# Patient Record
Sex: Male | Born: 1995 | Hispanic: No | Marital: Single | Smoking: Former smoker
Health system: Southern US, Community
[De-identification: ages and names within clinical notes are randomized; demographics above are authoritative.]

## PROBLEM LIST (undated history)

## (undated) DIAGNOSIS — G935 Compression of brain: Secondary | ICD-10-CM

## (undated) DIAGNOSIS — J45909 Unspecified asthma, uncomplicated: Secondary | ICD-10-CM

## (undated) HISTORY — DX: Compression of brain: G93.5

## (undated) HISTORY — PX: TONSILLECTOMY: SUR1361

---

## 2016-02-23 ENCOUNTER — Encounter (HOSPITAL_COMMUNITY): Payer: Self-pay | Admitting: Emergency Medicine

## 2016-02-23 ENCOUNTER — Emergency Department (HOSPITAL_COMMUNITY)
Admission: EM | Admit: 2016-02-23 | Discharge: 2016-02-23 | Disposition: A | Discharge status: Home / Self Care | Payer: Managed Care, Other (non HMO) | Attending: Emergency Medicine | Admitting: Emergency Medicine

## 2016-02-23 ENCOUNTER — Emergency Department (HOSPITAL_COMMUNITY): Payer: Managed Care, Other (non HMO)

## 2016-02-23 DIAGNOSIS — N200 Calculus of kidney: Secondary | ICD-10-CM | POA: Diagnosis not present

## 2016-02-23 DIAGNOSIS — J45909 Unspecified asthma, uncomplicated: Secondary | ICD-10-CM | POA: Diagnosis not present

## 2016-02-23 DIAGNOSIS — R109 Unspecified abdominal pain: Secondary | ICD-10-CM | POA: Diagnosis present

## 2016-02-23 HISTORY — DX: Unspecified asthma, uncomplicated: J45.909

## 2016-02-23 LAB — BASIC METABOLIC PANEL
Anion gap: 14 (ref 5–15)
BUN: 14 mg/dL (ref 6–20)
CALCIUM: 9.7 mg/dL (ref 8.9–10.3)
CO2: 22 mmol/L (ref 22–32)
CREATININE: 1.14 mg/dL (ref 0.61–1.24)
Chloride: 105 mmol/L (ref 101–111)
GFR calc non Af Amer: >60 mL/min (ref >60)
GLUCOSE: 110 mg/dL — AB (ref 65–99)
Potassium: 3.6 mmol/L (ref 3.5–5.1)
Sodium: 141 mmol/L (ref 135–145)

## 2016-02-23 LAB — URINALYSIS, ROUTINE W REFLEX MICROSCOPIC
BILIRUBIN URINE: NEGATIVE
GLUCOSE, UA: NEGATIVE mg/dL
KETONES UR: NEGATIVE mg/dL
Leukocytes, UA: NEGATIVE
NITRITE: NEGATIVE
PH: 8.5 — AB (ref 5.0–8.0)
PROTEIN: NEGATIVE mg/dL
Specific Gravity, Urine: 1.014 (ref 1.005–1.030)

## 2016-02-23 LAB — CBC
HEMATOCRIT: 47.1 % (ref 39.0–52.0)
HEMOGLOBIN: 16.4 g/dL (ref 13.0–17.0)
MCH: 30.9 pg (ref 26.0–34.0)
MCHC: 34.8 g/dL (ref 30.0–36.0)
MCV: 88.7 fL (ref 78.0–100.0)
PLATELETS: 222 10*3/uL (ref 150–400)
RBC: 5.31 MIL/uL (ref 4.22–5.81)
RDW: 12.3 % (ref 11.5–15.5)
WBC: 10.2 10*3/uL (ref 4.0–10.5)

## 2016-02-23 LAB — URINE MICROSCOPIC-ADD ON: WBC, UA: NONE SEEN WBC/hpf (ref 0–5)

## 2016-02-23 MED ORDER — ONDANSETRON HCL 4 MG/2ML IJ SOLN
4.0000 mg | Freq: Once | INTRAMUSCULAR | Status: AC
  Administered 2016-02-23: 4 mg via INTRAVENOUS
  Filled 2016-02-23: qty 2

## 2016-02-23 MED ORDER — SODIUM CHLORIDE 0.9 % IV BOLUS (SEPSIS)
1000.0000 mL | Freq: Once | INTRAVENOUS | Status: AC
  Administered 2016-02-23: 1000 mL via INTRAVENOUS

## 2016-02-23 MED ORDER — HYDROMORPHONE HCL 1 MG/ML IJ SOLN
1.0000 mg | Freq: Once | INTRAMUSCULAR | Status: AC
  Administered 2016-02-23: 1 mg via INTRAVENOUS
  Filled 2016-02-23: qty 1

## 2016-02-23 MED ORDER — MORPHINE SULFATE (PF) 4 MG/ML IV SOLN
4.0000 mg | Freq: Once | INTRAVENOUS | Status: AC
  Administered 2016-02-23: 4 mg via INTRAVENOUS
  Filled 2016-02-23: qty 1

## 2016-02-23 MED ORDER — KETOROLAC TROMETHAMINE 30 MG/ML IJ SOLN
30.0000 mg | Freq: Once | INTRAMUSCULAR | Status: AC
  Administered 2016-02-23: 30 mg via INTRAVENOUS
  Filled 2016-02-23: qty 1

## 2016-02-23 NOTE — Discharge Instructions (Signed)
Please read and follow all provided instructions.  Your diagnoses today include:  1. Nephrolithiasis    Tests performed today include:  Urine test that showed blood in your urine and no infection  CT scan which showed a 1-2 millimeter kidney on the right side  Blood test that showed normal kidney function  Vital signs. See below for your results today.   Medications prescribed:   Take any prescribed medications only as directed.  Home care instructions:  Follow any educational materials contained in this packet.   BE VERY CAREFUL not to take multiple medicines containing Tylenol (also called acetaminophen). Doing so can lead to an overdose which can damage your liver and cause liver failure and possibly death.   Follow-up instructions: Please follow-up with your PCP if symptoms develop again.   If you need to return to the Emergency Department, go to [facility] and not [facility]. The urologists are located at [facility] and can better care for you at this location.  Return instructions:  If you need to return to the Emergency Department, go to [facility] and not [facility]. The urologists are located at [facility] and can better care for you at this location.   Please return to the Emergency Department if you experience worsening symptoms.  Please return if you develop fever or uncontrolled pain or vomiting.  Please return if you have any other emergent concerns.  Additional Information:  Your vital signs today were: BP 134/73 mmHg   Pulse 84   Temp(Src) 98 F (36.7 C) (Oral)   Resp 20   Ht 6' (1.829 m)   Wt 90.719 kg   BMI 27.12 kg/m2   SpO2 95% If your blood pressure (BP) was elevated above 135/85 this visit, please have this repeated by your doctor within one month. --------------

## 2016-02-23 NOTE — ED Provider Notes (Signed)
CSN: [id number]     Arrival date & time 02/23/16  1139 History   First MD Initiated Contact with Patient 02/23/16 1204     Chief Complaint  Patient presents with  . Back Pain   (Consider location/radiation/quality/duration/timing/severity/associated sxs/prior Treatment) HPI 20 y.o. male presents to the Emergency Department today complaining of back pain that began 45 min ago. 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 No hx kidney stones. No fevers reported. States that the pain is sharp and 10/10. Constant. No attempts at OTC remedies. No CP/SOB/ABD pain. Notes N/V no diarrhea. No other symptoms noted.   Past Medical History  Diagnosis Date  . Asthma    No past surgical history on file. No family history on file. Social History  Substance Use Topics  . Smoking status: None  . Smokeless tobacco: None  . Alcohol Use: None    Review of Systems ROS reviewed and all are negative for acute change except as noted in the HPI.  Allergies  Benadryl  Home Medications   Prior to Admission medications   Medication Sig Start Date End Date Taking? Authorizing Provider  methylPREDNISolone (MEDROL DOSEPAK) 4 MG TBPK tablet Reported on 02/23/2016 02/13/16   Historical Provider, MD  penicillin v potassium (VEETID) 500 MG tablet Take 500 mg by mouth 4 (four) times daily - after meals and at bedtime. Reported on 02/23/2016 02/13/16  Yes Historical Provider, MD   BP 141/71 mmHg  Pulse 70  Temp(Src) 98 F (36.7 C) (Oral)  Resp 16  Ht 6' (1.829 m)  Wt 90.719 kg  BMI 27.12 kg/m2  SpO2 100%   Physical Exam  Constitutional: He is oriented to person, place, and time. He appears well-developed and well-nourished.  Pt having trouble finding comfortable position due to pain in right flank.   HENT:  Head: Normocephalic and atraumatic.  Eyes: EOM are normal. Pupils are equal, round, and reactive to light.  Neck:  Normal range of motion. Neck supple. No tracheal deviation present.  Cardiovascular: Normal rate, regular rhythm and normal heart sounds.   Pulmonary/Chest: Effort normal and breath sounds normal.  Abdominal: Soft. Normal appearance and bowel sounds are normal. There is tenderness. There is CVA tenderness. There is no rigidity, no rebound, no guarding, no tenderness at McBurney's point and negative Murphy's sign.  Musculoskeletal: Normal range of motion.       Cervical back: Normal.       Thoracic back: Normal.       Lumbar back: Normal.  Neurological: He is alert and oriented to person, place, and time.  Skin: Skin is warm and dry.  Psychiatric: He has a normal mood and affect. His behavior is normal. Thought content normal.  Nursing note and vitals reviewed.  ED Course  Procedures (including critical care time) Labs Review Labs Reviewed  BASIC METABOLIC PANEL - Abnormal; Notable for the following:    Glucose, Bld 110 (*)    All other components within normal limits  URINALYSIS, ROUTINE W REFLEX MICROSCOPIC (NOT AT [facility]) - Abnormal; Notable for the following:    pH 8.5 (*)    Hgb urine dipstick LARGE (*)    All other components within normal limits  URINE MICROSCOPIC-ADD ON - Abnormal; Notable for the following:    Squamous Epithelial / LPF 0-5 (*)    Bacteria, UA RARE (*)    All other components within normal limits  CBC   Imaging Review Ct Renal Stone Study  02/23/2016  CLINICAL DATA:  20 year old with acute onset of right flank pain while at work earlier today. 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 EXAM: CT ABDOMEN AND PELVIS WITHOUT CONTRAST TECHNIQUE: Multidetector CT imaging of the abdomen and pelvis was performed following the standard protocol without IV contrast. COMPARISON:  None. FINDINGS: Lower chest:  Heart size normal.  Visualized lung bases clear. Hepatobiliary: Normal unenhanced appearance  of the liver. Gallbladder normal in appearance without calcified gallstones. No biliary ductal dilation. Pancreas: Normal unenhanced appearance. Spleen: Normal unenhanced appearance. Adrenals/Urinary Tract: Normal appearing adrenal glands. Approximate 1-2 mm calculus at the right UVJ without associated right hydronephrosis. No urinary tract calculi elsewhere on either side. No evidence of right renal edema or perinephric edema. Within the limits of the unenhanced technique, no focal parenchymal abnormality involving either kidney. Normal appearing urinary bladder. Stomach/Bowel: Stomach normal in appearance for the degree of distention. Normal-appearing small bowel. Normal-appearing colon with expected stool burden. Normal-appearing appendix without evidence of inflammation. Vascular/Lymphatic: No evidence of atherosclerosis. No pathologic lymphadenopathy. Reproductive: Prostate gland and seminal vesicles normal in size and appearance for age. Other: None. Musculoskeletal: Regional skeleton intact without acute or significant osseous abnormality. IMPRESSION: 1. Nonobstructing 1-2 mm calculus at the right UVJ. 2. Otherwise normal examination. Electronically Signed   By: Hulan Saas M.D.   On: 02/23/2016 13:56   I have personally reviewed and evaluated these images and lab results as part of my medical decision-making.   EKG Interpretation None      MDM  I have reviewed and evaluated the relevant laboratory values. I have reviewed and evaluated the relevant imaging studies. I have reviewed the relevant previous healthcare records. I obtained HPI from historian. Patient discussed with supervising physician  ED Course:  Assessment: 19y M Pt has been diagnosed with a Kidney Stone via CT. There is no evidence of significant hydronephrosis, serum creatine WNL, vitals sign stable and the pt does not have irratractable vomiting. On exam, Lungs CTA. Heart RRR. Abdomen nontender/soft. Has CVA tenderness.  Given fluids and pain medications in ED. Pt will be dc home with pain medications & has been advised to follow up with PCP.  2:40 PM- While giving urine sample, patient was able to pass the stone. No longer symptomatic. At time of discharge, Patient is in no acute distress. Vital Signs are stable. Patient is able to ambulate. Patient able to tolerate PO.   Disposition/Plan:  DC Home Additional Verbal discharge instructions given and discussed with patient.  Pt Instructed to f/u with PCP if further symptoms develop Return precautions given Pt acknowledges and agrees with plan  Supervising Physician Azalia Bilis, MD   Final diagnoses:  Nephrolithiasis      Audry Pili, PA-C 02/23/16 1506  Azalia Bilis, MD 02/23/16 [phone]

## 2016-02-23 NOTE — ED Notes (Signed)
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 Pt denies any urinary symptoms. Pt is alert and oriented x 4.

## 2016-06-30 ENCOUNTER — Emergency Department (HOSPITAL_COMMUNITY)
Admission: EM | Admit: 2016-06-30 | Discharge: 2016-06-30 | Disposition: A | Discharge status: Home / Self Care | Payer: Managed Care, Other (non HMO) | Attending: Emergency Medicine | Admitting: Emergency Medicine

## 2016-06-30 ENCOUNTER — Encounter (HOSPITAL_COMMUNITY): Payer: Self-pay | Admitting: Emergency Medicine

## 2016-06-30 ENCOUNTER — Emergency Department (HOSPITAL_COMMUNITY): Payer: Managed Care, Other (non HMO)

## 2016-06-30 DIAGNOSIS — Z79899 Other long term (current) drug therapy: Secondary | ICD-10-CM | POA: Insufficient documentation

## 2016-06-30 DIAGNOSIS — J45909 Unspecified asthma, uncomplicated: Secondary | ICD-10-CM | POA: Insufficient documentation

## 2016-06-30 DIAGNOSIS — M79662 Pain in left lower leg: Secondary | ICD-10-CM | POA: Insufficient documentation

## 2016-06-30 DIAGNOSIS — M79605 Pain in left leg: Secondary | ICD-10-CM

## 2016-06-30 NOTE — Discharge Instructions (Signed)

## 2016-06-30 NOTE — ED Notes (Signed)
Per patient, he was playing softball last Tuesday (6-27) and he slid into base.  Patient saw MD on 7-1, and was given antibiotics.  He is still taking medication.  He states left leg is still sore and deep pain within.  He states he has limited ROM and walking with left leg.

## 2016-06-30 NOTE — ED Provider Notes (Signed)
CSN: [id number]     Arrival date & time 06/30/16  1234 History   First MD Initiated Contact with Patient 06/30/16 1259     Chief Complaint  Patient presents with  . Leg Pain     (Consider location/radiation/quality/duration/timing/severity/associated sxs/prior Treatment) HPI Comments: Sx started after direct trauma caused by sliding into an object Seen at urgent care and tx with abx for lle superficial abrasion Noted pain to left ant tib without calf tenderness No compartment sx  Patient is a 20 y.o. male presenting with leg pain. The history is provided by the patient.  Leg Pain Location:  Leg Time since incident:  7 days Leg location:  L lower leg Pain details:    Quality:  Dull   Radiates to:  Does not radiate   Severity:  Moderate   Onset quality:  Gradual   Duration:  7 days   Timing:  Constant   Progression:  Worsening Chronicity:  New Dislocation: no   Foreign body present:  No foreign bodies Prior injury to area:  Yes Worsened by:  Bearing weight Ineffective treatments:  None tried Associated symptoms: swelling   Associated symptoms: no fever, no numbness and no tingling     Past Medical History  Diagnosis Date  . Asthma    History reviewed. No pertinent past surgical history. No family history on file. Social History  Substance Use Topics  . Smoking status: Never Smoker   . Smokeless tobacco: Never Used  . Alcohol Use: Yes    Review of Systems  Constitutional: Negative for fever.  All other systems reviewed and are negative.     Allergies  Benadryl  Home Medications   Prior to Admission medications   Medication Sig Start Date End Date Taking? Authorizing Provider  methylPREDNISolone (MEDROL DOSEPAK) 4 MG TBPK tablet Reported on 02/23/2016 02/13/16   Historical Provider, MD  penicillin v potassium (VEETID) 500 MG tablet Take 500 mg by mouth 4 (four) times daily - after meals and at bedtime. Reported on 02/23/2016 02/13/16   Historical Provider, MD     BP 147/72 mmHg  Pulse 77  Temp(Src) 98 F (36.7 C) (Oral)  Resp 16  SpO2 100% Physical Exam  Constitutional: He is oriented to person, place, and time. He appears well-developed and well-nourished.  Non-toxic appearance.  HENT:  Head: Normocephalic and atraumatic.  Eyes: Conjunctivae are normal. Pupils are equal, round, and reactive to light.  Neck: Normal range of motion.  Cardiovascular: Normal rate.   Pulmonary/Chest: Effort normal.  Musculoskeletal:       Legs: Neurological: He is alert and oriented to person, place, and time.  Skin: Skin is warm and dry.  Psychiatric: He has a normal mood and affect.  Nursing note and vitals reviewed.   ED Course  Procedures (including critical care time) Labs Review Labs Reviewed - No data to display  Imaging Review No results found. I have personally reviewed and evaluated these images and lab results as part of my medical decision-making.   EKG Interpretation None      MDM   Final diagnoses:  None    Patient's x-rays are negative at the tib-fib but does show a possible superior lateral patella fracture. Patient has no evidence of trauma to that area and has no complaints of pain. Patient will be made nonweightbearing.    Lorre NickAnthony Allyssa Abruzzese, MD 06/30/16 1350

## 2017-03-21 IMAGING — CT CT RENAL STONE PROTOCOL
2 of 4 series · 15 of 35 positions shown, 19 images · non-contrast
Comparison: None.

CLINICAL DATA: 19-year-old with acute onset of right flank pain
while at work earlier today. Patient works as a [HOSPITAL]
facility and was bending over while making a bed and developed the
right flank pain.

EXAM:
CT ABDOMEN AND PELVIS WITHOUT CONTRAST
TECHNIQUE: Multidetector CT imaging of the abdomen and pelvis was performed
following the standard protocol without IV contrast.

[Series 4: lung windows · axial · 0.78mm/px · z∈[-212,-122]mm · 12 of 21 slices shown, 16 images]
[im 2/21  soft-tissue]
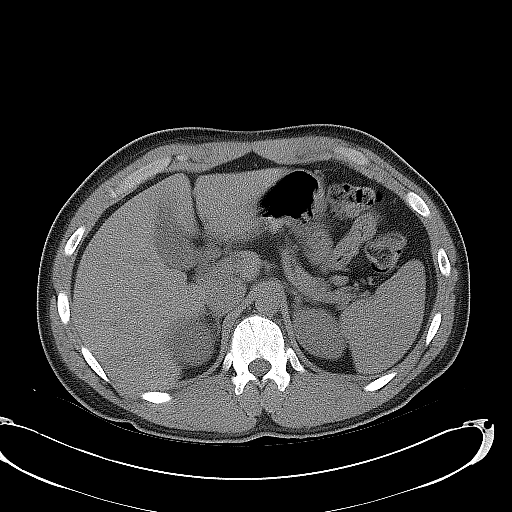
[im 2/21  bone]
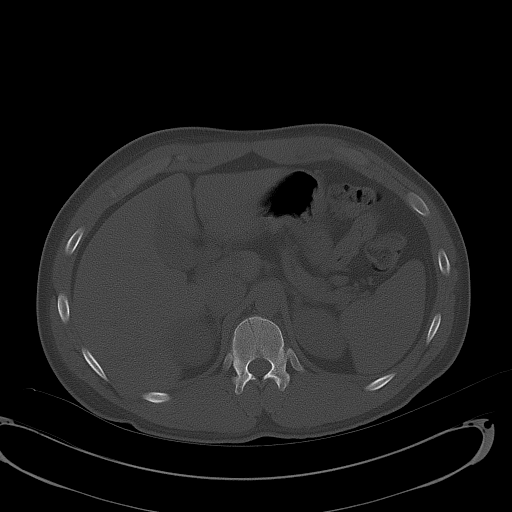
[im 4/21  soft-tissue]
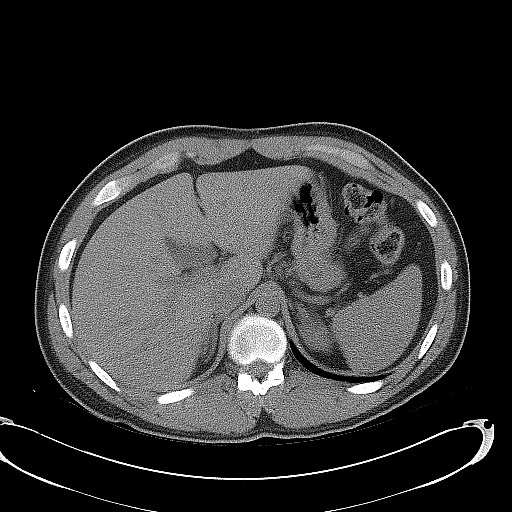
[im 6/21  soft-tissue]
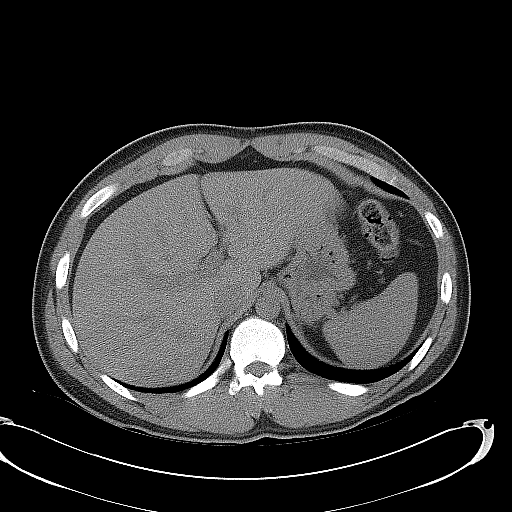
[im 8/21  soft-tissue]
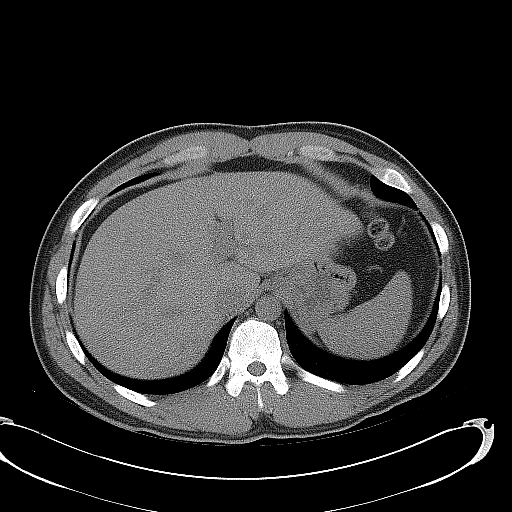
[im 10/21  soft-tissue]
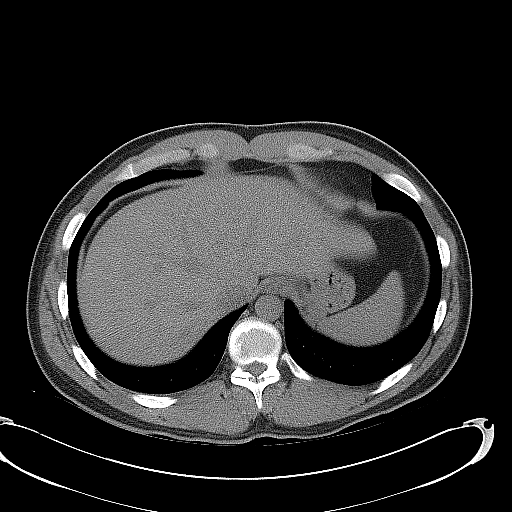
[im 12/21  soft-tissue]
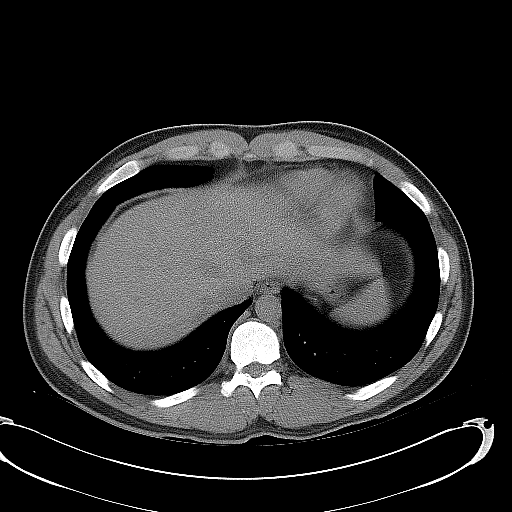
[im 14/21  soft-tissue]
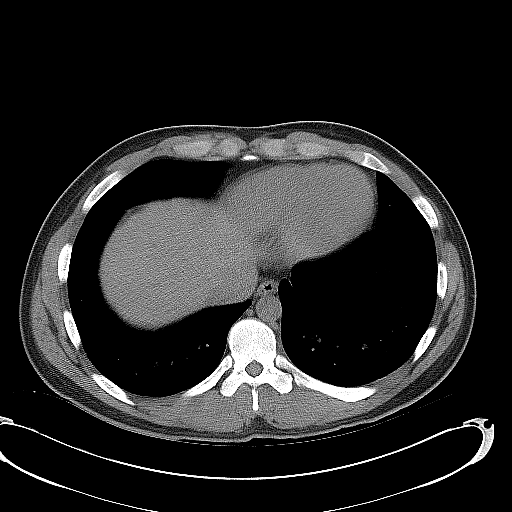
[im 16/21  soft-tissue]
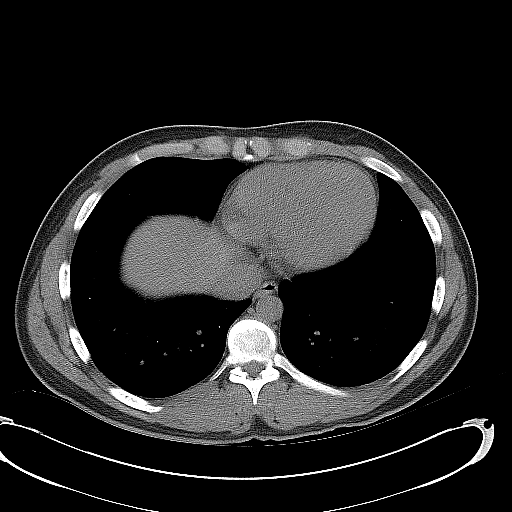
[im 17/21  lung]
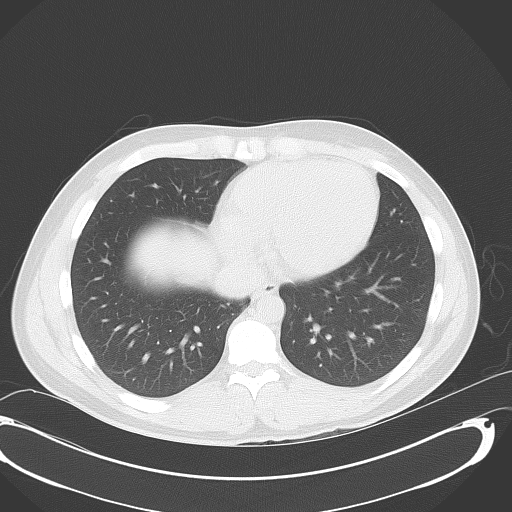
[im 18/21  soft-tissue]
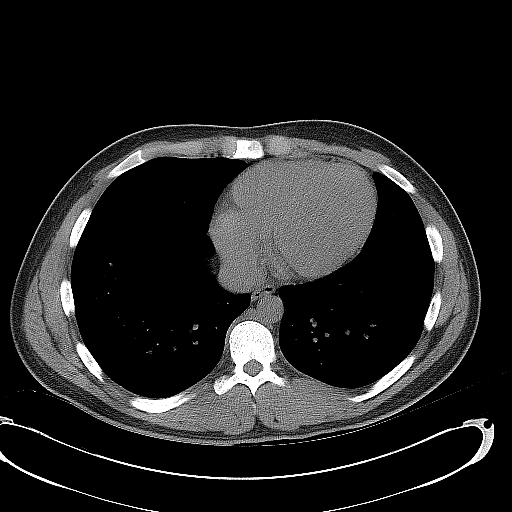
[im 18/21  lung]
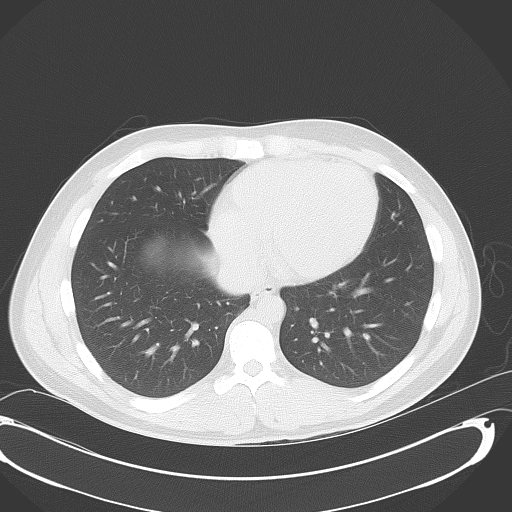
[im 18/21  bone]
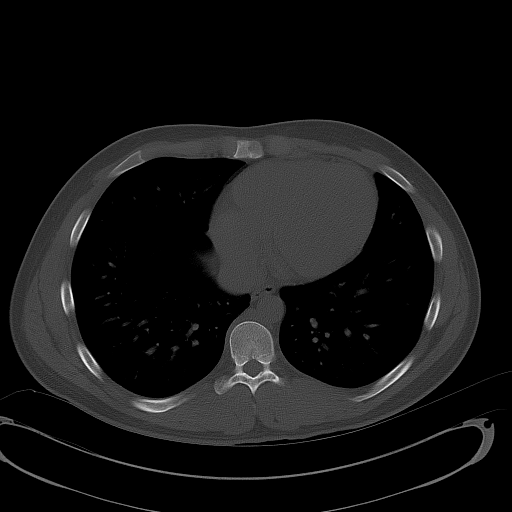
[im 19/21  lung]
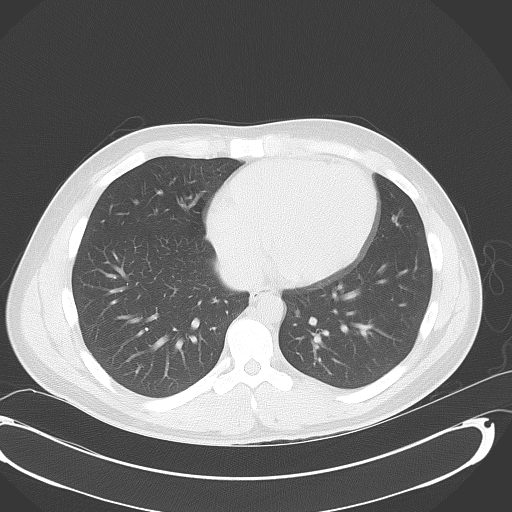
[im 20/21  soft-tissue]
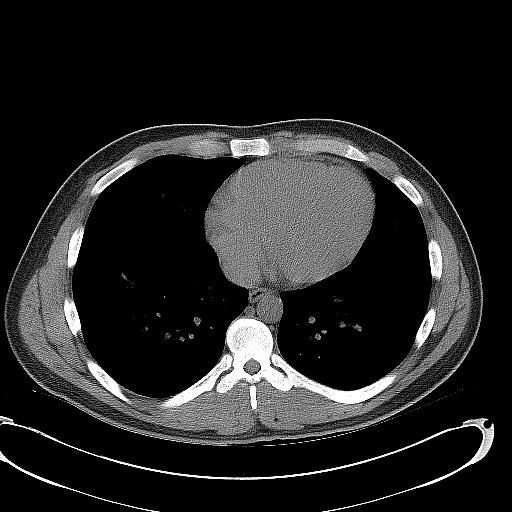
[im 20/21  lung]
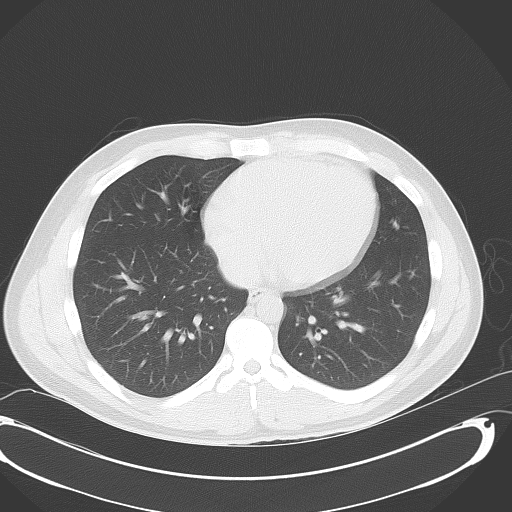

[Series 602: <mpr thick range> · coronal · 0.89mm/px · 3 of 132 slices shown]
[im 44/132  soft-tissue]
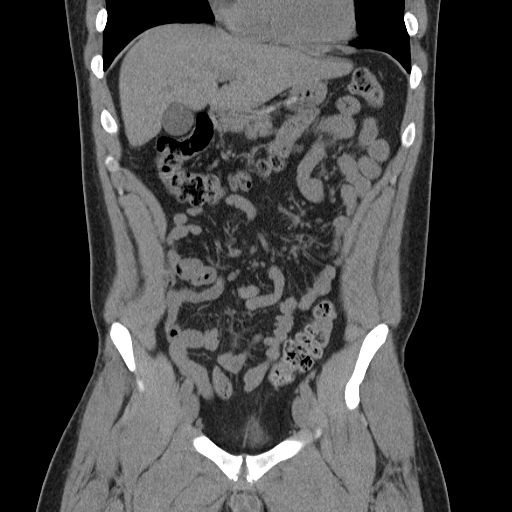
[im 59/132  soft-tissue]
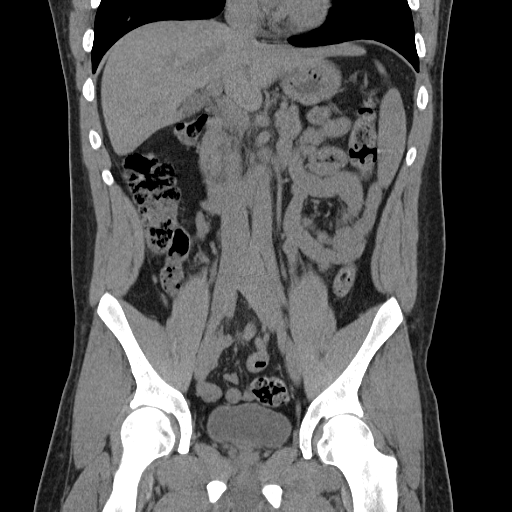
[im 73/132  soft-tissue]
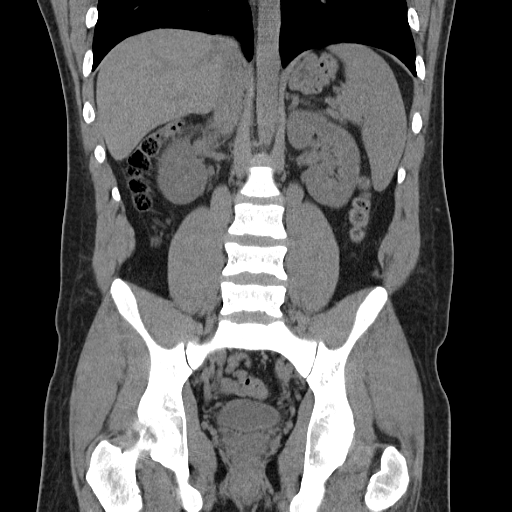

[15 of 35 positions shown; findings below may reference images not displayed]

FINDINGS: Lower chest:  Heart size normal.  Visualized lung bases clear.

Hepatobiliary: Normal unenhanced appearance of the liver.
Gallbladder normal in appearance without calcified gallstones. No
biliary ductal dilation.

Pancreas: Normal unenhanced appearance.

Spleen: Normal unenhanced appearance.

Adrenals/Urinary Tract: Normal appearing adrenal glands. Approximate
1-2 mm calculus at the right UVJ without associated right
hydronephrosis. No urinary tract calculi elsewhere on either side.
No evidence of right renal edema or perinephric edema. Within the
limits of the unenhanced technique, no focal parenchymal abnormality
involving either kidney. Normal appearing urinary bladder.

Stomach/Bowel: Stomach normal in appearance for the degree of
distention. Normal-appearing small bowel. Normal-appearing colon
with expected stool burden. Normal-appearing appendix without
evidence of inflammation.

Vascular/Lymphatic: No evidence of atherosclerosis. No pathologic
lymphadenopathy.

Reproductive: Prostate gland and seminal vesicles normal in size and
appearance for age.

Other: None.

Musculoskeletal: Regional skeleton intact without acute or
significant osseous abnormality.
IMPRESSION: 1. Nonobstructing 1-2 mm calculus at the right UVJ.
2. Otherwise normal examination.

## 2019-04-12 ENCOUNTER — Ambulatory Visit (HOSPITAL_COMMUNITY)
Admission: EM | Admit: 2019-04-12 | Discharge: 2019-04-12 | Disposition: A | Discharge status: Home / Self Care | Payer: Managed Care, Other (non HMO) | Attending: Family Medicine | Admitting: Family Medicine

## 2019-04-12 ENCOUNTER — Other Ambulatory Visit: Payer: Self-pay

## 2019-04-12 ENCOUNTER — Encounter (HOSPITAL_COMMUNITY): Payer: Self-pay

## 2019-04-12 DIAGNOSIS — M542 Cervicalgia: Secondary | ICD-10-CM | POA: Diagnosis not present

## 2019-04-12 DIAGNOSIS — R221 Localized swelling, mass and lump, neck: Secondary | ICD-10-CM

## 2019-04-12 MED ORDER — AMOXICILLIN-POT CLAVULANATE 875-125 MG PO TABS: 1.0000 | ORAL_TABLET | Freq: Two times a day (BID) | ORAL | 0 refills | Status: AC

## 2019-04-12 NOTE — ED Triage Notes (Signed)
Pt presents with pain and swelling on left side of neck X 4 days.

## 2019-04-12 NOTE — Discharge Instructions (Signed)
Rest and push fluids We will treat you for potential neck abscess today We will trial a prescription of augmentin.  Take as directed and to completion You may use OTC ibuprofen and/or tylenol as needed for pain Go to the ED if you have any new or worsening symptoms such as increased pain, redness, swelling, discharge, high fever, night sweats, abdominal pain, difficulty swallowing, difficulty breathing, painful neck movements, dental pain, if symptoms do not improve with medication, etc..Marland Kitchen

## 2019-04-12 NOTE — ED Provider Notes (Signed)
[facility]   [id number] 04/12/19 Arrival Time: 1020   CC: Neck pain and swelling  SUBJECTIVE: History from: patient.  Christopher Nash is a 23 y.o. male who presents with left sided neck pain and swelling x 4 days.  Denies sick exposure to COVID, flu or strep.  Denies recent travel.  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  Has tried OTC medications without relief.  Symptoms are made worse with neck ROM.  Reports previous symptoms in the past that resolved without intervention.   Denies fever, chills, fatigue, sinus pain, rhinorrhea, sore throat, dysphagia, odonyphagia, cough, SOB, wheezing, chest pain, nausea, changes in bowel or bladder habits.    ROS: As per HPI.  Past Medical History:  Diagnosis Date  . Asthma    History reviewed. No pertinent surgical history. Allergies  Allergen Reactions  . Benadryl [Diphenhydramine Hcl] Anaphylaxis   No current facility-administered medications on file prior to encounter.    No current outpatient medications on file prior to encounter.   Social History   Socioeconomic History  . Marital status: Single    Spouse name: Not on file  . Number of children: Not on file  . Years of education: Not on file  . Highest education level: Not on file  Occupational History  . Not on file  Social Needs  . Financial resource strain: Not on file  . Food insecurity:    Worry: Not on file    Inability: Not on file  . Transportation needs:    Medical: Not on file    Non-medical: Not on file  Tobacco Use  . Smoking status: Never Smoker  . Smokeless tobacco: Never Used  Substance and Sexual Activity  . Alcohol use: Yes  . Drug use: No  . Sexual activity: Yes  Lifestyle  . Physical activity:    Days per week: Not on file    Minutes per session: Not on file  . Stress: Not on file  Relationships  . Social connections:    Talks on phone: Not on file    Gets together: Not on file    Attends religious service: Not on file    Active  member of club or organization: Not on file    Attends meetings of clubs or organizations: Not on file    Relationship status: Not on file  . Intimate partner violence:    Fear of current or ex partner: Not on file    Emotionally abused: Not on file    Physically abused: Not on file    Forced sexual activity: Not on file  Other Topics Concern  . Not on file  Social History Narrative  . Not on file   History reviewed. No pertinent family history.  OBJECTIVE:  Vitals:   04/12/19 1048  BP: (!) 142/83  Pulse: 63  Resp: 18  Temp: 98.3 F (36.8 C)  TempSrc: Oral  SpO2: 98%     General appearance: alert; well-appearing, nontoxic; speaking in full sentences and tolerating own secretions HEENT: NCAT; Ears: EACs clear, RT TM pearly gray, LT TM mildly erythematous; Eyes: PERRL.  EOM grossly intact. Nose: nares patent without rhinorrhea, Throat: oropharynx clear, tonsils not erythematous or enlarged, uvula midline Neck: 3-4 x 2 cm area of induration over the left submandibular region, no overlying skin changes, mildly TTP; no obvious drainage or bleeding Lungs: unlabored respirations, symmetrical air entry; cough: absent; no respiratory distress; CTAB Heart: regular rate and rhythm.  Radial pulses 2+ symmetrical bilaterally Skin:  warm and dry Psychological: alert and cooperative; normal mood and affect  ASSESSMENT & PLAN:  1. Neck pain   2. Neck abscess     Meds ordered this encounter  Medications  . amoxicillin-clavulanate (AUGMENTIN) 875-125 MG tablet    Sig: Take 1 tablet by mouth every 12 (twelve) hours for 10 days.    Dispense:  20 tablet    Refill:  0    Order Specific Question:   Supervising Provider    Answer:   Eustace Moore [[id number]]   Rest and push fluids We will treat you for potential neck abscess today We will trial a prescription of augmentin.  Take as directed and to completion You may use OTC ibuprofen and/or tylenol as needed for pain Go to the ED if  you have any new or worsening symptoms such as increased pain, redness, swelling, discharge, high fever, night sweats, abdominal pain, difficulty swallowing, difficulty breathing, painful neck movements, dental pain, if symptoms do not improve with medication, etc...   Instructed patient to make a follow up appointment with Joaquin Courts FNP if symptoms persisted despite antibiotic intervention.  Patient verbalized understanding.    Reviewed expectations re: course of current medical issues. Questions answered. Outlined signs and symptoms indicating need for more acute intervention. Patient verbalized understanding. After Visit Summary given.         Rennis Harding, PA-C 04/12/19 1130

## 2019-04-27 ENCOUNTER — Encounter: Payer: Self-pay | Admitting: Family Medicine

## 2019-05-03 ENCOUNTER — Ambulatory Visit: Payer: Managed Care, Other (non HMO) | Admitting: Family Medicine

## 2021-10-08 ENCOUNTER — Encounter (HOSPITAL_BASED_OUTPATIENT_CLINIC_OR_DEPARTMENT_OTHER): Payer: Self-pay

## 2021-10-08 DIAGNOSIS — R0683 Snoring: Secondary | ICD-10-CM

## 2021-10-08 DIAGNOSIS — G4733 Obstructive sleep apnea (adult) (pediatric): Secondary | ICD-10-CM

## 2021-10-08 DIAGNOSIS — G4710 Hypersomnia, unspecified: Secondary | ICD-10-CM

## 2021-11-24 ENCOUNTER — Other Ambulatory Visit: Payer: Self-pay

## 2021-11-24 ENCOUNTER — Ambulatory Visit (HOSPITAL_BASED_OUTPATIENT_CLINIC_OR_DEPARTMENT_OTHER): Payer: Self-pay | Attending: Sleep Medicine | Admitting: Internal Medicine

## 2021-11-24 DIAGNOSIS — G4733 Obstructive sleep apnea (adult) (pediatric): Secondary | ICD-10-CM | POA: Insufficient documentation

## 2021-11-24 DIAGNOSIS — R0683 Snoring: Secondary | ICD-10-CM | POA: Insufficient documentation

## 2021-11-24 DIAGNOSIS — G4710 Hypersomnia, unspecified: Secondary | ICD-10-CM | POA: Insufficient documentation

## 2021-11-29 NOTE — Procedures (Signed)
   NAME: Christopher Nash DATE OF BIRTH:  1996-05-28 MEDICAL RECORD NUMBER [id number]  LOCATION: [facility]  PHYSICIAN: Deretha Emory  DATE OF STUDY: 11/24/2021  SLEEP STUDY TYPE: Nocturnal Polysomnogram               REFERRING PHYSICIAN: Alanda Slim, MD  EPWORTH SLEEPINESS SCORE:  10 HEIGHT: 5\' 11"  (180.3 cm)  WEIGHT: 240 lb (108.9 kg)    Body mass index is 33.47 kg/m.  NECK SIZE: 16 in.  CLINICAL INFORMATION The patient was referred to the sleep center for evaluation excessive daytime sleepiness, witnessed apnea, and snoring. 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   MEDICATIONS Patient self administered medications none including no sleep medicine administered.  SLEEP STUDY TECHNIQUE A multi-channel overnight Polysomnography study was performed. The channels recorded and monitored were central and occipital EEG, electrooculogram (EOG), submentalis EMG (chin), nasal and oral airflow, thoracic and abdominal wall motion, anterior tibialis EMG, snore microphone, electrocardiogram, and a pulse oximetry.  TECHNICAL COMMENTS Comments added by Technician: None Comments added by Scorer: N/A  SLEEP ARCHITECTURE The study was initiated at 11:06:38 PM and terminated at 5:09:06 AM. The total recorded time was 362.5 minutes. EEG confirmed total sleep time was 329.5 minutes yielding a sleep efficiency of 90.9%. Sleep onset after lights out was 4.9 minutes with a REM latency of 98.5 minutes. The patient spent 8.19% of the night in stage N1 sleep, 61.61% in stage N2 sleep, 1.97% in stage N3 and 28.2% in REM. Wake after sleep onset (WASO) was 28.1 minutes. The Arousal Index was 6.7/hour.  RESPIRATORY PARAMETERS There were a total of 47 respiratory disturbances out of which 12 were apneas ( 11 obstructive, 0 mixed, 1 central) and 35 hypopneas. The apnea/hypopnea index (AHI) was 8.6 events/hour. The central sleep apnea index was 0.2 events/hour. The REM AHI  was 23.9 events/hour and NREM AHI was 2.5 events/hour. The supine AHI was 31.0 events/hour and the non supine AHI was 7.5 events/hour. He slept supine during 4.70% of sleep. Respiratory disturbance index was 12/hr overall and 30/hour in REM sleep. Respiratory disturbances were associated with oxygen desaturation down to a nadir of 86.00% during sleep. The mean oxygen saturation during the study was 95.06%.  LEG MOVEMENT DATA The total leg movements were with a resulting leg movement index of /hr . Associated arousal with leg movement index was 0.0/hr.  CARDIAC DATA The underlying cardiac rhythm was most consistent with sinus rhythm. Mean heart rate during sleep was 47.57 bpm. Additional rhythm abnormalities include None.  IMPRESSIONS - Mild Obstructive Sleep apnea(OSA) - No Significant Central Sleep Apnea (CSA) - No significant periodic leg movements(PLMs) during sleep.   DIAGNOSIS - Obstructive Sleep Apnea (G47.33)  RECOMMENDATIONS - It is not clear that this mild degree of OSA is causing sleepiness especially in a patient with third shift job. If treatment is thought necessary, consider weight loss, oral appliance or CPAP   Marland Kitchen Sleep specialist, American Board of Internal Medicine  ELECTRONICALLY SIGNED ON:  11/29/2021, 9:27 AM [facility] PH: [phone]   FX: [phone] ACCREDITED BY THE AMERICAN ACADEMY OF SLEEP MEDICINE

## 2022-02-13 ENCOUNTER — Other Ambulatory Visit: Payer: Self-pay | Admitting: Internal Medicine

## 2022-02-13 ENCOUNTER — Ambulatory Visit
Admission: RE | Admit: 2022-02-13 | Discharge: 2022-02-13 | Disposition: A | Discharge status: Home / Self Care | Payer: No Typology Code available for payment source | Source: Ambulatory Visit | Attending: Internal Medicine | Admitting: Internal Medicine

## 2022-02-13 ENCOUNTER — Other Ambulatory Visit: Payer: Self-pay

## 2022-02-13 DIAGNOSIS — R0789 Other chest pain: Secondary | ICD-10-CM

## 2022-11-11 ENCOUNTER — Other Ambulatory Visit (HOSPITAL_BASED_OUTPATIENT_CLINIC_OR_DEPARTMENT_OTHER): Payer: Self-pay | Admitting: Internal Medicine

## 2022-11-11 DIAGNOSIS — R0789 Other chest pain: Secondary | ICD-10-CM

## 2022-11-18 ENCOUNTER — Ambulatory Visit
Admission: EM | Admit: 2022-11-18 | Discharge: 2022-11-18 | Disposition: A | Discharge status: Home / Self Care | Payer: No Typology Code available for payment source | Attending: Emergency Medicine | Admitting: Emergency Medicine

## 2022-11-18 DIAGNOSIS — L509 Urticaria, unspecified: Secondary | ICD-10-CM | POA: Diagnosis not present

## 2022-11-18 MED ORDER — LORATADINE 10 MG PO TABS: 10.0000 mg | ORAL_TABLET | Freq: Every day | ORAL | 0 refills | Status: DC

## 2022-11-18 MED ORDER — PREDNISONE 50 MG PO TABS: ORAL_TABLET | ORAL | 0 refills | Status: DC

## 2022-11-18 MED ORDER — EPINEPHRINE 0.3 MG/0.3ML IJ SOAJ: 0.3000 mg | Freq: Once | INTRAMUSCULAR | 0 refills | Status: AC

## 2022-11-18 MED ORDER — FAMOTIDINE 20 MG PO TABS: 20.0000 mg | ORAL_TABLET | Freq: Two times a day (BID) | ORAL | 0 refills | Status: DC

## 2022-11-18 MED ORDER — FAMOTIDINE 40 MG PO TABS
40.0000 mg | ORAL_TABLET | Freq: Once | ORAL | Status: AC
  Administered 2022-11-18: 40 mg via ORAL

## 2022-11-18 MED ORDER — EPINEPHRINE 0.3 MG/0.3ML IJ SOAJ: 0.3000 mg | Freq: Once | INTRAMUSCULAR | 0 refills | Status: DC

## 2022-11-18 MED ORDER — METHYLPREDNISOLONE SODIUM SUCC 40 MG IJ SOLR
125.0000 mg | Freq: Once | INTRAMUSCULAR | Status: AC
  Administered 2022-11-18: 125 mg via INTRAMUSCULAR

## 2022-11-18 NOTE — Discharge Instructions (Addendum)
You can start the oral steroids tomorrow.  Take a Claritin today.  Today take the Pepcid, Claritin for the next 5 days and prednisone for 4 more days-total 5 days.  Use the EpiPen if you start having wheezing, shortness of breath, tongue or lip swelling, sensation of throat swelling shut, abdominal pain, diarrhea, or if you pass out.  Go to the ER if you need to use the EpiPen. Go to the ER if you get worse.Marland Kitchen

## 2022-11-18 NOTE — ED Triage Notes (Signed)
Patient reports that he got off work this AM and noticed that he was broke out in hives from head to toes.

## 2022-11-18 NOTE — ED Provider Notes (Signed)
HPI  SUBJECTIVE:  Christopher Nash is a 26 y.o. male who presents with an intensely pruritic erythematous, raised rash beginning on his chest earlier this morning.  It has now spread to the rest of his torso and bilateral thighs.  No new lotions, soaps, detergents, change in medications, recent antibiotics, known exposure to allergens, exposure to cleaning supplies or dust.  No angioedema of the lips or tongue, sensation of throat swelling shut, cough, wheeze, shortness of breath, nausea, vomiting, diarrhea, abdominal pain, syncope, voice changes.  He tried taking a hot shower with improvement in his symptoms.  No aggravating factors.  Is not needed his albuterol inhaler.  He has a past medical history of asthma and food allergies.  He is also allergic to Benadryl/over-the-counter cough syrup as a child.  He has a past medical history of Chiari malformation.  No history of anaphylaxis.  PCP: [facility].    Past Medical History:  Diagnosis Date   Asthma    Chiari malformation type I [facility] [place])     Past Surgical History:  Procedure Laterality Date   TONSILLECTOMY      Family History  Problem Relation Age of Onset   Hypertension Maternal Grandmother     Social History   Tobacco Use   Smoking status: Former   Smokeless tobacco: Never  Substance Use Topics   Alcohol use: Yes   Drug use: No    No current facility-administered medications for this encounter.  Current Outpatient Medications:    EPINEPHrine 0.3 mg/0.3 mL IJ SOAJ injection, Inject 0.3 mg into the muscle once for 1 dose., Disp: 0.3 mL, Rfl: 0   famotidine (PEPCID) 20 MG tablet, Take 1 tablet (20 mg total) by mouth 2 (two) times daily., Disp: 10 tablet, Rfl: 0   loratadine (CLARITIN) 10 MG tablet, Take 1 tablet (10 mg total) by mouth daily for 5 days., Disp: 5 tablet, Rfl: 0   predniSONE (DELTASONE) 50 MG tablet, 1 tab po on day 1, one tab po on day 2, 1/2 tab po on days 3 and 4, Disp: 3 tablet, Rfl: 0  Allergies   Allergen Reactions   Benadryl [Diphenhydramine Hcl] Anaphylaxis   Other Other (See Comments)    Uncoded Allergy. Allergen: Grass, Other Reaction: Other reaction     ROS  As noted in HPI.   Physical Exam  BP (!) 151/95 (BP Location: Right Arm)   Pulse 62   Temp 97.8 F (36.6 C) (Oral)   Ht 5\' 11"  (1.803 m)   Wt 97.5 kg   SpO2 100%   BMI 29.99 kg/m   Constitutional: Well developed, well nourished, no acute distress Eyes:  EOMI, conjunctiva normal bilaterally HENT: Normocephalic, atraumatic,mucus membranes moist.  No angioedema of the tongue or lips.  Airway widely patent.  Normal voice. Respiratory: Normal inspiratory effort, lungs clear bilaterally Cardiovascular: Normal rate and rhythm, no murmurs rubs or gallops GI: nondistended skin: Diffuse, nontender, blanchable, erythematous urticaria over entire torso, bilateral thighs.             Musculoskeletal: no deformities Neurologic: Alert & oriented x 3, no focal neuro deficits Psychiatric: Speech and behavior appropriate   ED Course   Medications  methylPREDNISolone sodium succinate (SOLU-MEDROL) 40 mg/mL injection 125 mg (125 mg Intramuscular Given 11/18/22 1416)  famotidine (PEPCID) tablet 40 mg (40 mg Oral Given 11/18/22 1413)    No orders of the defined types were placed in this encounter.   No results found for this or any previous visit (from  the past 24 hour(s)). No results found.  ED Clinical Impression  1. Urticaria      ED Assessment/Plan    On initial evaluation, patient has no evidence of anaphylaxis.  Will give Solu-Medrol 125 mg IM, Pepcid 40 mg p.o.  No Benadryl because of childhood history of allergy.  Do not have second-generation antihistamine here unfortunately.  Will reevaluate in 45 minutes.  On reevaluation, patient states that he feels better.  He still has some areas of erythema but the urticaria is resolving.  Lungs are still clear.  No evidence of angioedema or  anaphylaxis.  Home with prednisone, Pepcid, Claritin, EpiPen.  He is to take Claritin today, and then start the prednisone, Pepcid and Claritin tomorrow.  Follow-up with PCP as needed.  Discussed  MDM, treatment plan, and plan for follow-up with patient. Discussed sn/sx that should prompt return to the ED. patient agrees with plan.   Meds ordered this encounter  Medications   methylPREDNISolone sodium succinate (SOLU-MEDROL) 40 mg/mL injection 125 mg   famotidine (PEPCID) tablet 40 mg   loratadine (CLARITIN) 10 MG tablet    Sig: Take 1 tablet (10 mg total) by mouth daily for 5 days.    Dispense:  5 tablet    Refill:  0   predniSONE (DELTASONE) 50 MG tablet    Sig: 1 tab po on day 1, one tab po on day 2, 1/2 tab po on days 3 and 4    Dispense:  3 tablet    Refill:  0   EPINEPHrine 0.3 mg/0.3 mL IJ SOAJ injection    Sig: Inject 0.3 mg into the muscle once for 1 dose.    Dispense:  0.3 mL    Refill:  0   famotidine (PEPCID) 20 MG tablet    Sig: Take 1 tablet (20 mg total) by mouth 2 (two) times daily.    Dispense:  10 tablet    Refill:  0      *This clinic note was created using Scientist, clinical (histocompatibility and immunogenetics). Therefore, there may be occasional mistakes despite careful proofreading.  ?    Domenick Gong, MD 11/18/22 [phone]

## 2022-11-24 ENCOUNTER — Ambulatory Visit: Payer: No Typology Code available for payment source

## 2022-12-02 ENCOUNTER — Ambulatory Visit
Admission: RE | Admit: 2022-12-02 | Discharge: 2022-12-02 | Disposition: A | Discharge status: Home / Self Care | Payer: No Typology Code available for payment source | Source: Ambulatory Visit | Attending: Internal Medicine | Admitting: Internal Medicine

## 2022-12-02 DIAGNOSIS — R0789 Other chest pain: Secondary | ICD-10-CM | POA: Insufficient documentation

## 2023-07-21 ENCOUNTER — Ambulatory Visit
Admission: EM | Admit: 2023-07-21 | Discharge: 2023-07-21 | Disposition: A | Discharge status: Home / Self Care | Payer: 59 | Attending: Emergency Medicine | Admitting: Emergency Medicine

## 2023-07-21 ENCOUNTER — Other Ambulatory Visit: Payer: Self-pay

## 2023-07-21 DIAGNOSIS — Z20822 Contact with and (suspected) exposure to covid-19: Secondary | ICD-10-CM | POA: Insufficient documentation

## 2023-07-21 DIAGNOSIS — J069 Acute upper respiratory infection, unspecified: Secondary | ICD-10-CM | POA: Insufficient documentation

## 2023-07-21 DIAGNOSIS — J4521 Mild intermittent asthma with (acute) exacerbation: Secondary | ICD-10-CM | POA: Diagnosis not present

## 2023-07-21 LAB — GROUP A STREP BY PCR: Group A Strep by PCR: NOT DETECTED

## 2023-07-21 LAB — SARS CORONAVIRUS 2 BY RT PCR: SARS Coronavirus 2 by RT PCR: NEGATIVE

## 2023-07-21 MED ORDER — IBUPROFEN 600 MG PO TABS: 600.0000 mg | ORAL_TABLET | Freq: Three times a day (TID) | ORAL | 0 refills | Status: AC | PRN

## 2023-07-21 MED ORDER — AEROCHAMBER PLUS FLO-VU MEDIUM MISC: 1.0000 | Freq: Once | Status: DC

## 2023-07-21 MED ORDER — PREDNISONE 20 MG PO TABS: 40.0000 mg | ORAL_TABLET | Freq: Every day | ORAL | 0 refills | Status: AC

## 2023-07-21 MED ORDER — FLUTICASONE PROPIONATE 50 MCG/ACT NA SUSP: 2.0000 | Freq: Every day | NASAL | 0 refills | Status: AC

## 2023-07-21 NOTE — ED Triage Notes (Signed)
Pt states he has had a runny nose, cough sore throat and chest congestion. Denies fever and symptoms started last night

## 2023-07-21 NOTE — ED Provider Notes (Signed)
HPI  SUBJECTIVE:  Christopher Nash is a 27 y.o. male who presents with sore throat, nasal congestion, chest congestion, tightness, headache, clear rhinorrhea, dry cough and central chest soreness present only with coughing starting yesterday.  No fevers, body aches, sinus pain or pressure, postnasal drip, wheezing, shortness of breath, nausea, vomiting, diarrhea, abdominal pain.  He was able to sleep last night without coughing.  No known COVID exposure,  although he is a Engineer, civil (consulting) at [employer].  He had 2 doses of the COVID-vaccine.  He tried rest without improvement in his symptoms.  Symptoms are worse was sleeping.  He has a past medical history of asthma, he was admitted as a child.  No recent steroids.  No intubations.  He has not needed his albuterol inhaler since symptoms started.  He is status post tonsillectomy and has a Chiari malformation.    Past Medical History:  Diagnosis Date   Asthma    Chiari malformation type I [facility])     Past Surgical History:  Procedure Laterality Date   TONSILLECTOMY      Family History  Problem Relation Age of Onset   Hypertension Maternal Grandmother     Social History   Tobacco Use   Smoking status: Former   Smokeless tobacco: Never  Substance Use Topics   Alcohol use: Yes   Drug use: No    No current facility-administered medications for this encounter.  Current Outpatient Medications:    fluticasone (FLONASE) 50 MCG/ACT nasal spray, Place 2 sprays into both nostrils daily., Disp: 16 g, Rfl: 0   ibuprofen (ADVIL) 600 MG tablet, Take 1 tablet (600 mg total) by mouth every 8 (eight) hours as needed., Disp: 30 tablet, Rfl: 0   predniSONE (DELTASONE) 20 MG tablet, Take 2 tablets (40 mg total) by mouth daily with breakfast for 5 days., Disp: 10 tablet, Rfl: 0  Allergies  Allergen Reactions   Benadryl [Diphenhydramine Hcl] Anaphylaxis   Other Other (See Comments)    Uncoded Allergy. Allergen: Grass, Other Reaction: Other reaction     ROS  As  noted in HPI.   Physical Exam  BP (!) 146/99   Pulse 86   Temp 98.5 F (36.9 C)   Resp 18   SpO2 99%   Constitutional: Well developed, well nourished, no acute distress Eyes:  EOMI, conjunctiva normal bilaterally HENT: Normocephalic, atraumatic,mucus membranes moist.  Extensive clear rhinorrhea.  Erythematous, swollen turbinates.  Nasal, frontal sinus tenderness.  Tonsils surgically absent.  Positive postnasal drip. Neck: No cervical lymphadenopathy Respiratory: Normal inspiratory effort, lungs clear bilaterally, fair air movement with slightly prolonged expiratory phase. Cardiovascular: Normal rate, regular rhythm, no murmurs rubs or gallops GI: nondistended skin: No rash, skin intact Musculoskeletal: no deformities Neurologic: Alert & oriented x 3, no focal neuro deficits Psychiatric: Speech and behavior appropriate   ED Course   Medications - No data to display   Orders Placed This Encounter  Procedures   Group A Strep by PCR    Standing Status:   Standing    Number of Occurrences:   1   SARS Coronavirus 2 by RT PCR (hospital order, performed in [facility] hospital lab) *cepheid single result test* Anterior Nasal Swab    Standing Status:   Standing    Number of Occurrences:   1    Results for orders placed or performed during the hospital encounter of 07/21/23 (from the past 24 hour(s))  Group A Strep by PCR     Status: None   Collection  Time: 07/21/23  5:56 PM   Specimen: Throat; Sterile Swab  Result Value Ref Range   Group A Strep by PCR NOT DETECTED NOT DETECTED  SARS Coronavirus 2 by RT PCR (hospital order, performed in [facility] hospital lab) *cepheid single result test* Anterior Nasal Swab     Status: None   Collection Time: 07/21/23  6:49 PM   Specimen: Anterior Nasal Swab  Result Value Ref Range   SARS Coronavirus 2 by RT PCR NEGATIVE NEGATIVE   No results found.  ED Clinical Impression  1. Upper respiratory tract infection, unspecified type   2.  Mild intermittent asthma with acute exacerbation   3. Encounter for laboratory testing for COVID-19 virus      ED Assessment/Plan     Presents with an upper respiratory infection and I suspect early asthma exacerbation.  Doubt pneumonia given the duration of symptoms, deferring x-ray today.  Checking COVID.  Patient does not want antivirals if COVID is positive.  Strep is negative.  Providing AeroChamber spacer here.  He does not need a prescription for albuterol.  Home with regularly scheduled albuterol inhaler with his spacer, saline nasal irrigation, Flonase, Mucinex D, prednisone 40 mg for 5 days.  He declined cough syrup and a work note.  COVID negative.  Plan as above.  Sent MyChart note.  Discussed MDM, treatment plan, and plan for follow-up with patient. patient agrees with plan.   Meds ordered this encounter  Medications   DISCONTD: AeroChamber Plus Flo-Vu Medium MISC 1 each   fluticasone (FLONASE) 50 MCG/ACT nasal spray    Sig: Place 2 sprays into both nostrils daily.    Dispense:  16 g    Refill:  0   predniSONE (DELTASONE) 20 MG tablet    Sig: Take 2 tablets (40 mg total) by mouth daily with breakfast for 5 days.    Dispense:  10 tablet    Refill:  0   ibuprofen (ADVIL) 600 MG tablet    Sig: Take 1 tablet (600 mg total) by mouth every 8 (eight) hours as needed.    Dispense:  30 tablet    Refill:  0      *This clinic note was created using Scientist, clinical (histocompatibility and immunogenetics). Therefore, there may be occasional mistakes despite careful proofreading.  ?    Domenick Gong, MD 07/22/23 [phone]

## 2023-07-21 NOTE — Discharge Instructions (Addendum)
2 puffs from your albuterol inhaler using your spacer every 4 hours for 2 days, then every 6 hours for 2 days, then as needed.  You can back off on the albuterol if you start to improve sooner.  Finish the prednisone, even if you feel better.  Flonase, saline nasal irrigation with a NeilMed sinus rinse and distilled water as often as you want Mucinex D to prevent a bacterial sinus infection.  Take 600 mg of ibuprofen with 1000 mg of Tylenol together 3 times a day as needed for headaches, body aches, fevers.  Will contact you if your COVID comes back positive.
